# Patient Record
Sex: Female | Born: 1990 | Hispanic: Yes | Smoking: Never smoker
Health system: Southern US, Community
[De-identification: ages and names within clinical notes are randomized; demographics above are authoritative.]

## PROBLEM LIST (undated history)

## (undated) DIAGNOSIS — E079 Disorder of thyroid, unspecified: Secondary | ICD-10-CM

## (undated) DIAGNOSIS — L409 Psoriasis, unspecified: Secondary | ICD-10-CM

## (undated) DIAGNOSIS — R87619 Unspecified abnormal cytological findings in specimens from cervix uteri: Secondary | ICD-10-CM

## (undated) DIAGNOSIS — D134 Benign neoplasm of liver: Secondary | ICD-10-CM

## (undated) HISTORY — DX: Benign neoplasm of liver: D13.4

## (undated) HISTORY — DX: Psoriasis, unspecified: L40.9

## (undated) HISTORY — DX: Unspecified abnormal cytological findings in specimens from cervix uteri: R87.619

---

## 2011-07-21 ENCOUNTER — Emergency Department: Payer: Self-pay | Admitting: Emergency Medicine

## 2011-12-01 ENCOUNTER — Emergency Department: Payer: Self-pay | Admitting: Emergency Medicine

## 2012-01-16 ENCOUNTER — Emergency Department: Payer: Self-pay | Admitting: Emergency Medicine

## 2012-01-16 LAB — BASIC METABOLIC PANEL
Calcium, Total: 9.1 mg/dL (ref 8.5–10.1)
Co2: 24 mmol/L (ref 21–32)
Creatinine: 0.61 mg/dL (ref 0.60–1.30)
EGFR (Non-African Amer.): >60
Glucose: 99 mg/dL (ref 65–99)
Osmolality: 280 (ref 275–301)
Potassium: 3.4 mmol/L — ABNORMAL LOW (ref 3.5–5.1)
Sodium: 141 mmol/L (ref 136–145)

## 2012-01-16 LAB — CBC
HCT: 37.2 % (ref 35.0–47.0)
HGB: 12.7 g/dL (ref 12.0–16.0)
MCH: 31.8 pg (ref 26.0–34.0)
MCHC: 34.2 g/dL (ref 32.0–36.0)
MCV: 93 fL (ref 80–100)
RDW: 12 % (ref 11.5–14.5)
WBC: 5.5 10*3/uL (ref 3.6–11.0)

## 2012-01-17 LAB — CK-MB: CK-MB: <0.5 ng/mL — ABNORMAL LOW (ref 0.5–3.6)

## 2012-01-17 LAB — CK: CK, Total: 67 U/L (ref 21–215)

## 2012-01-17 LAB — TROPONIN I: Troponin-I: <0.02 ng/mL

## 2013-05-31 IMAGING — US US EXTREM LOW VENOUS*R*
1 series · 17 of 23 positions shown · non-contrast
Comparison: none

REASON FOR EXAM: right leg pain sudden onset last pm  no injury   Flex 1
COMMENTS:

[Series 1: us extrem low venous*right* · 17 of 23 slices shown]
[im 1/23]
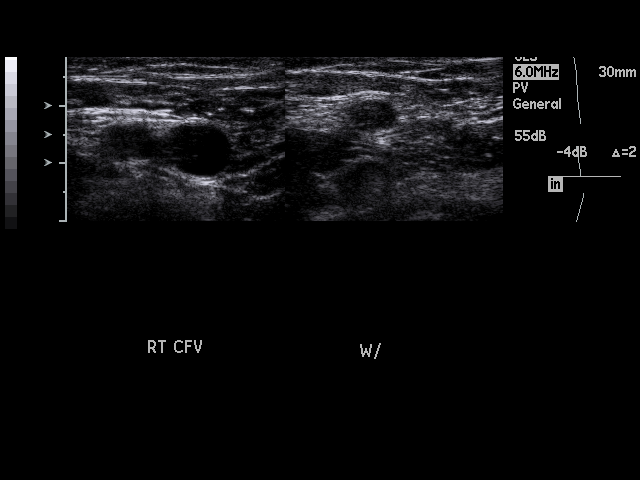
[im 3/23]
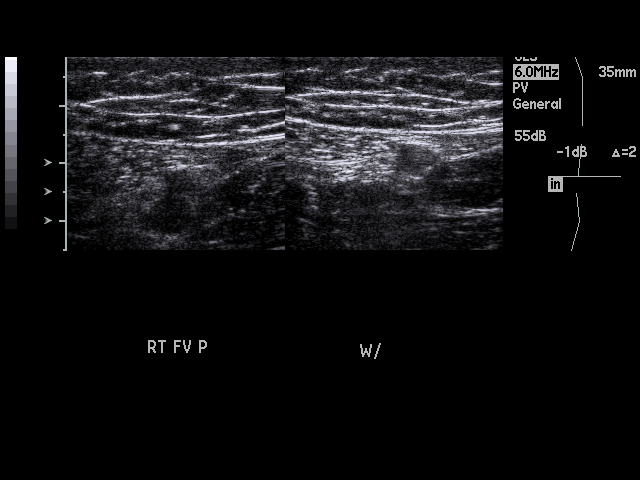
[im 4/23]
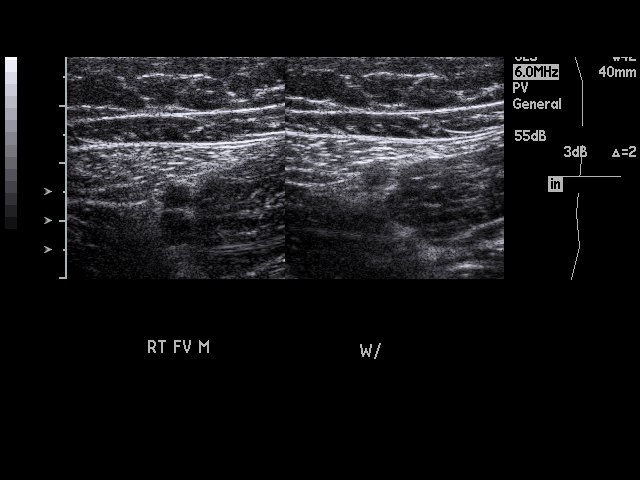
[im 5/23]
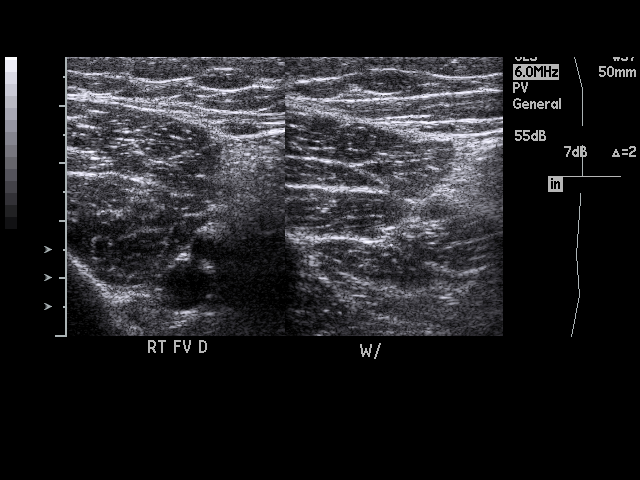
[im 7/23]
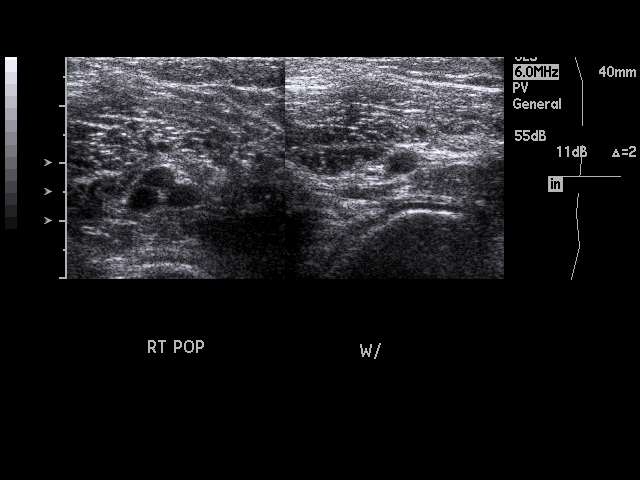
[im 8/23]
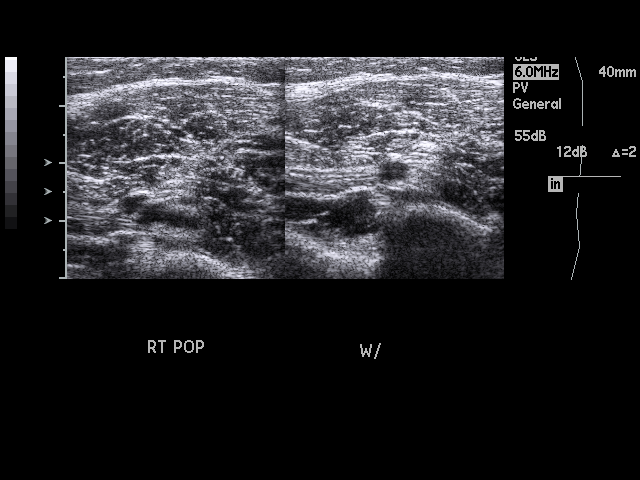
[im 9/23]
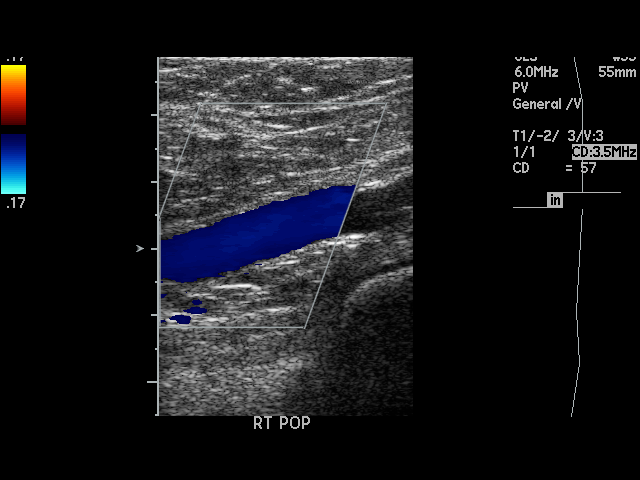
[im 11/23]
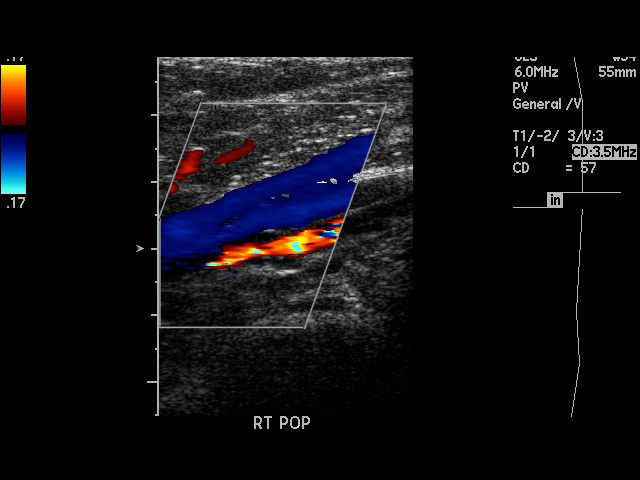
[im 12/23]
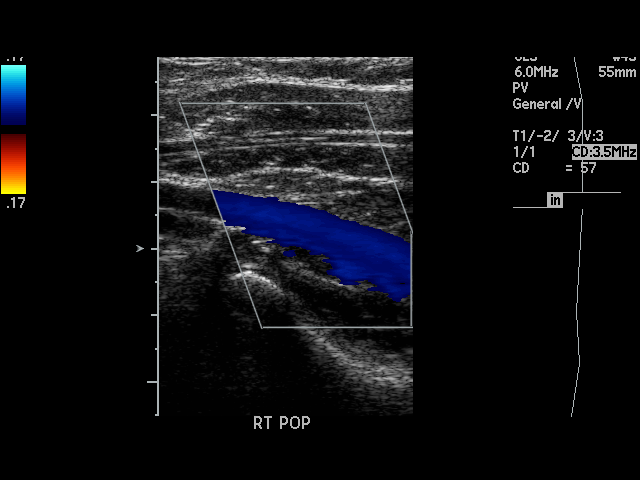
[im 13/23]
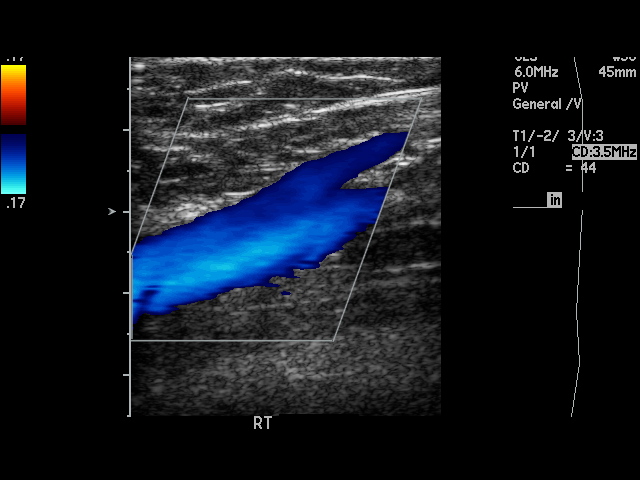
[im 15/23]
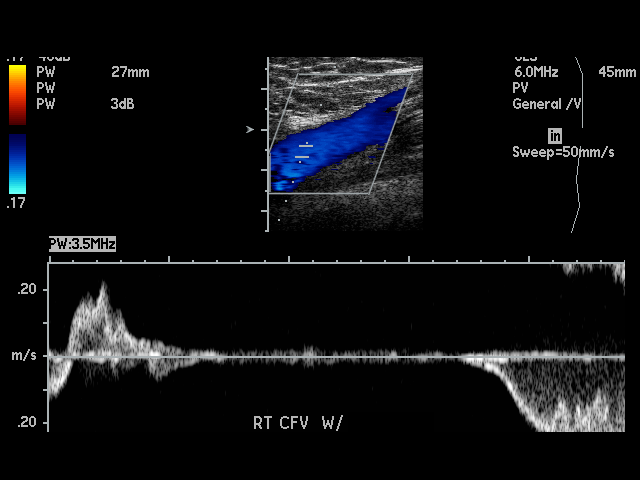
[im 16/23]
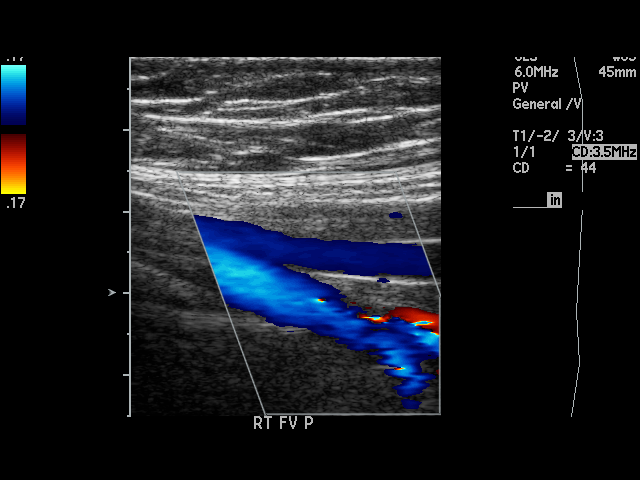
[im 17/23]
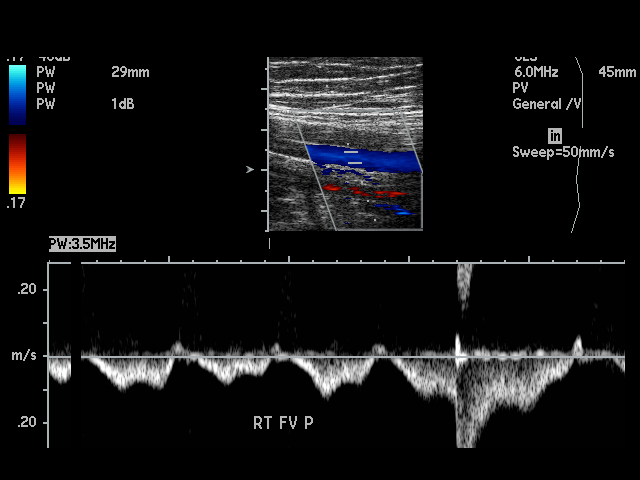
[im 19/23]
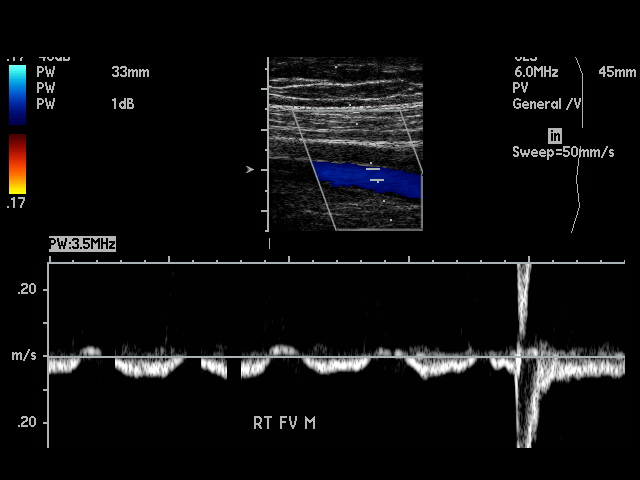
[im 20/23]
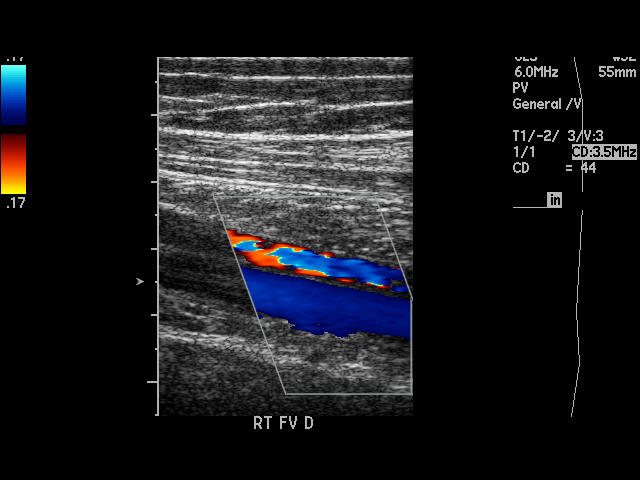
[im 21/23]
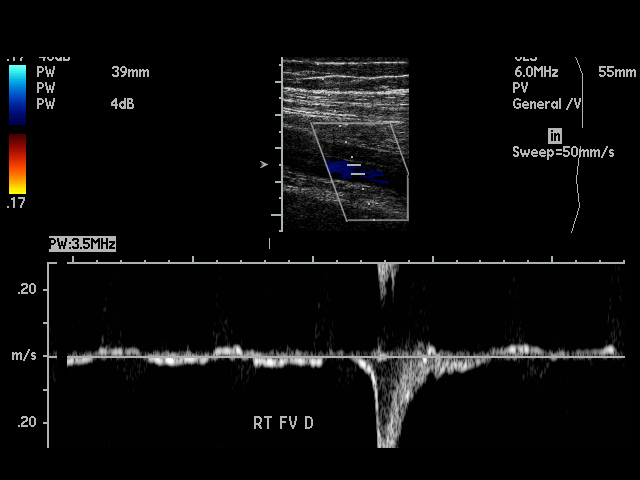
[im 23/23]
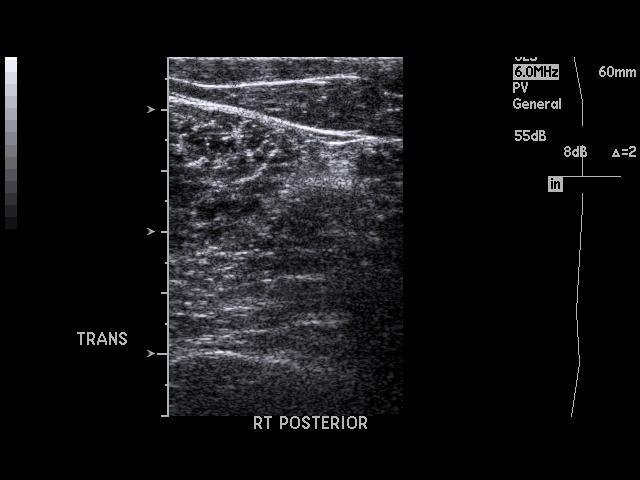

[17 of 23 positions shown; findings below may reference images not displayed]

PROCEDURE:     US  - US DOPPLER LOW EXTR RIGHT  - July 21, 2011  [DATE]

RESULT:     Comparison: None

Technique and findings: Multiple longitudinal and transverse grayscale as
well as color and spectral Doppler images of the right lower extremity veins
were obtained from the common femoral veins through the popliteal veins.

The right common femoral, femoral, and popliteal veins are patent,
demonstrating normal color-flow and compressibility. No intraluminal
thrombus is identified.  There is normal respiratory variation and
augmentation demonstrated at all vein levels. Limited images in the region
of pain in the posterior right thigh are unremarkable.
IMPRESSION: No evidence of DVT in the right lower extremity.

## 2013-07-08 DIAGNOSIS — D134 Benign neoplasm of liver: Secondary | ICD-10-CM

## 2013-07-08 HISTORY — DX: Benign neoplasm of liver: D13.4

## 2013-11-27 IMAGING — CR DG CHEST 2V
1 series · 2 of 2 positions shown · non-contrast
Comparison: none

REASON FOR EXAM: chest pain
COMMENTS:

[Series 1: w chest pa · 0.14mm/px · 2 of 2 slices shown]
[im 1/2]
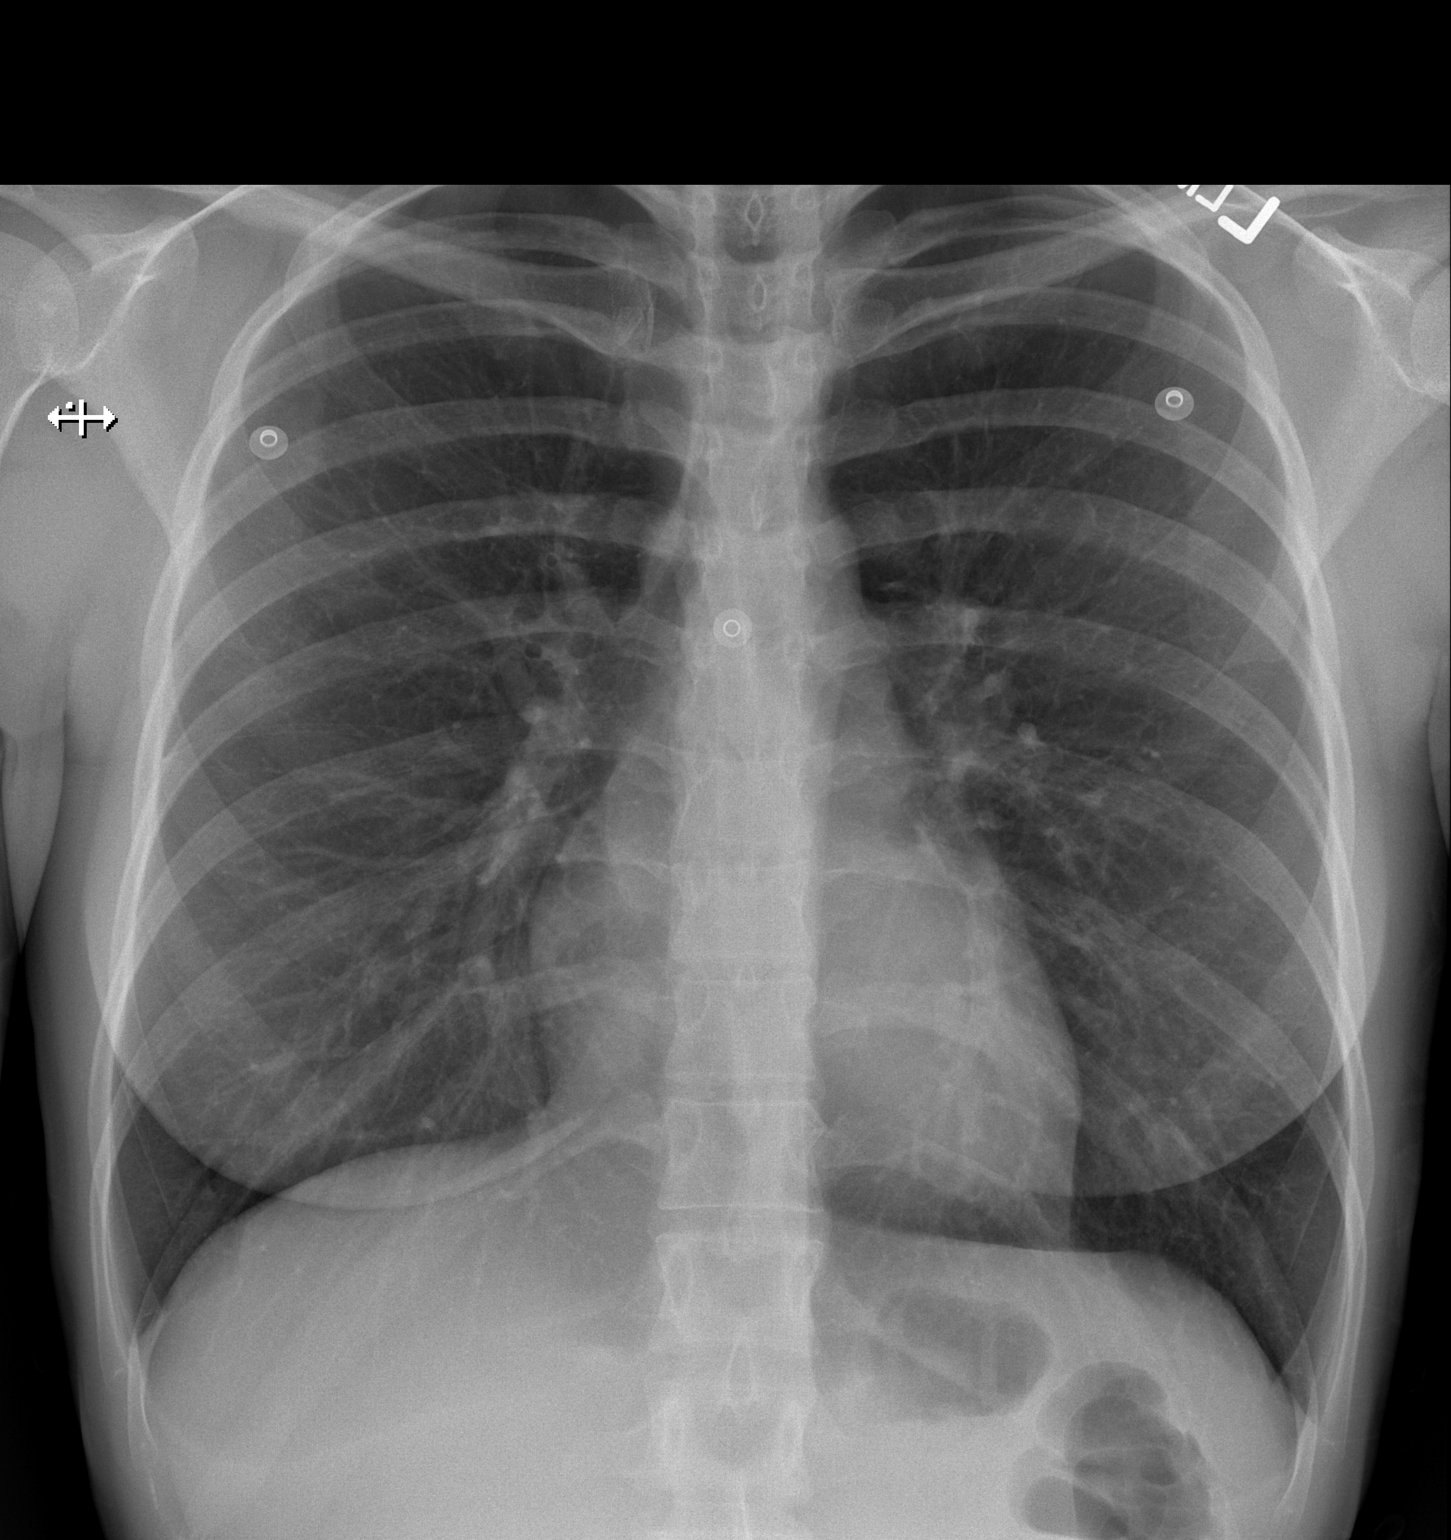
[im 2/2]
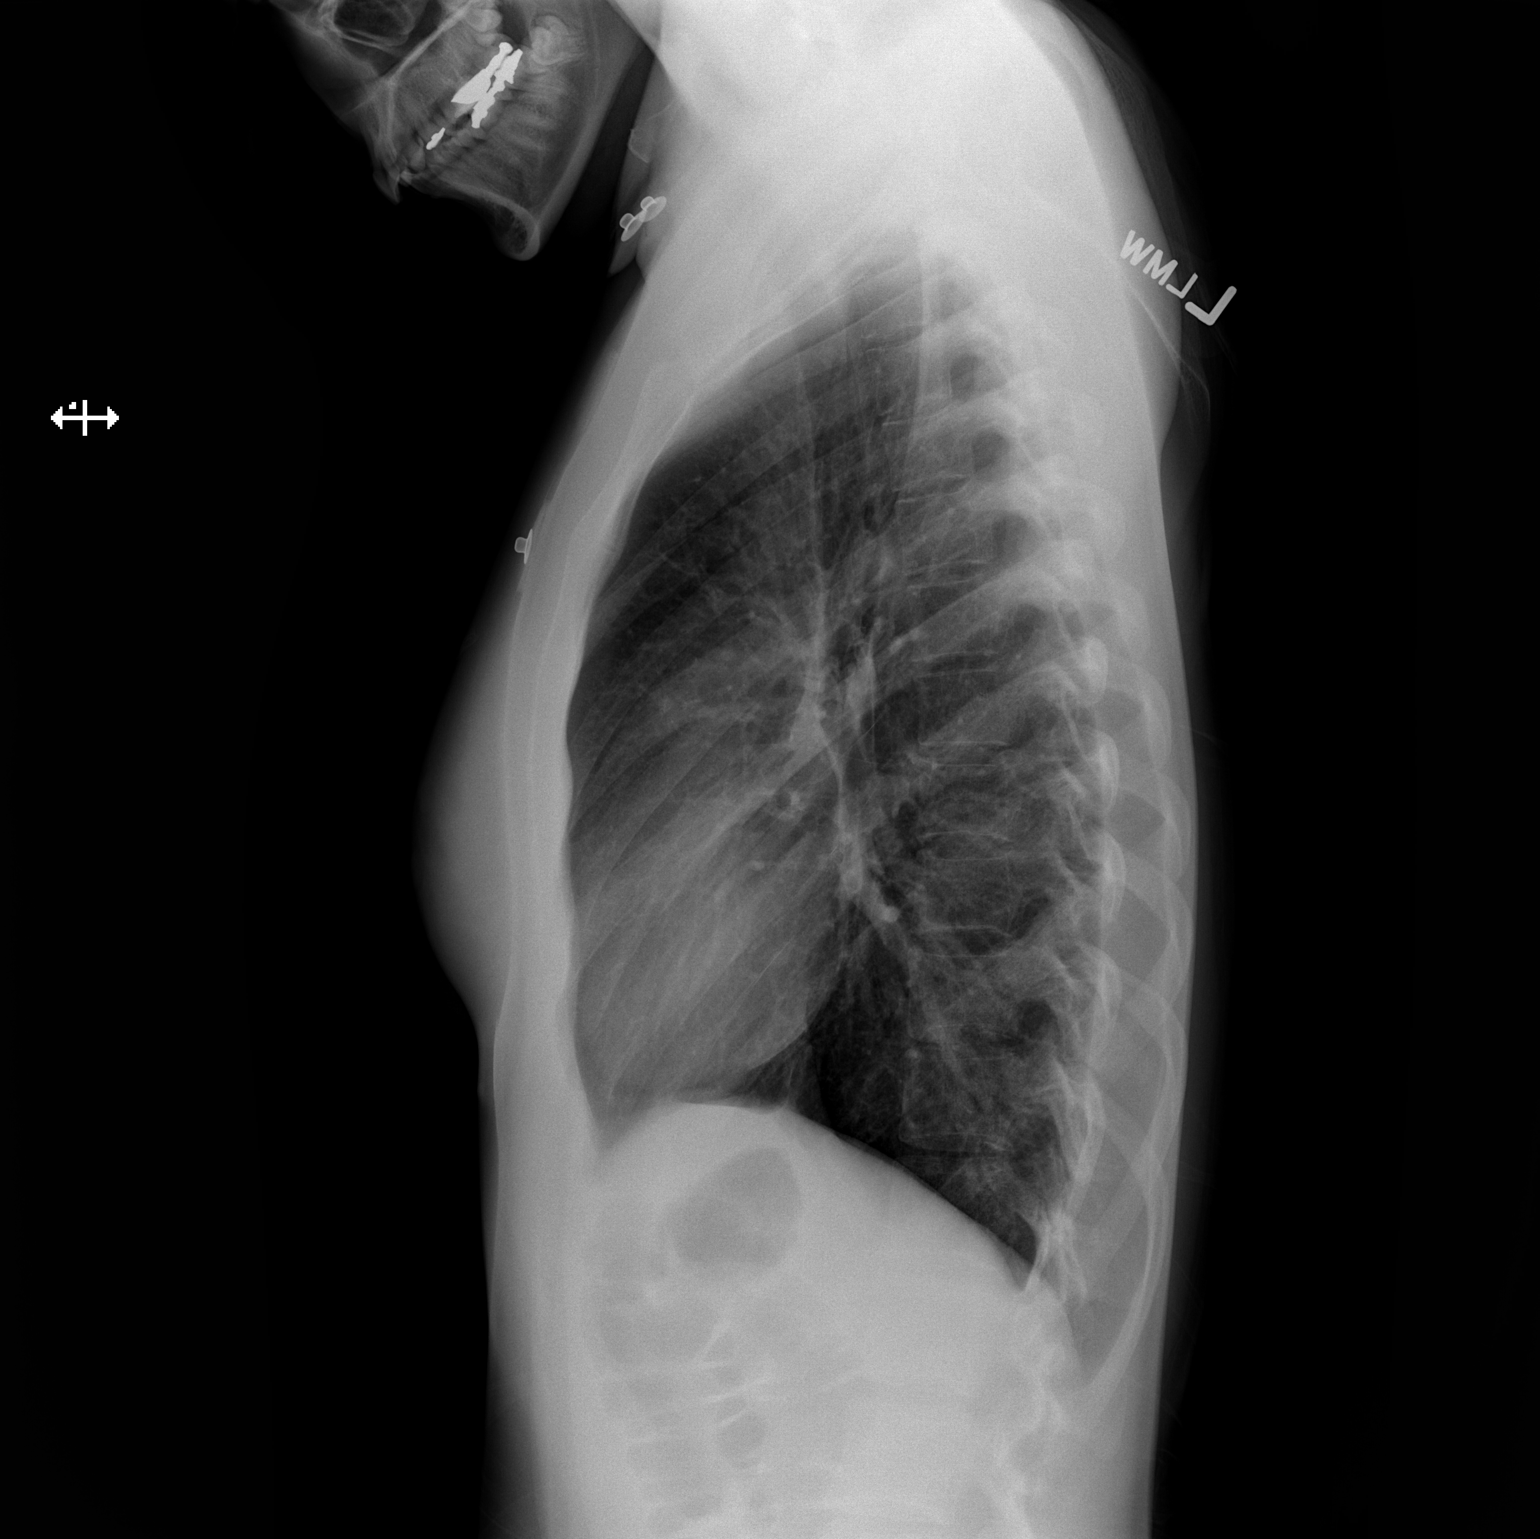

[2 of 2 positions shown; findings below may reference images not displayed]

PROCEDURE:     DXR - DXR CHEST PA (OR AP) AND LATERAL  - January 17, 2012  [DATE]

RESULT:     The lungs are mildly hyperinflated. There are coarse lung
markings in the retrocardiac region likely on the left. The cardiac
silhouette is normal in size. The pulmonary vascularity is not engorged. The
mediastinum is normal in width. There is no pneumothorax or
pneumomediastinum or pleural effusion. The bony thorax exhibits no acute
abnormality.
IMPRESSION: There is mild hyperinflation and there are coarse lung
markings in the retrocardiac region on the left. This may reflect reactive
airway disease and acute bronchitis with subsegmental atelectasis. Followup
films are recommended if the patient's symptoms persist despite therapy.

## 2014-07-08 DIAGNOSIS — L409 Psoriasis, unspecified: Secondary | ICD-10-CM

## 2014-07-08 HISTORY — DX: Psoriasis, unspecified: L40.9

## 2016-04-11 ENCOUNTER — Encounter: Payer: Self-pay | Admitting: Medical Oncology

## 2016-04-11 ENCOUNTER — Emergency Department
Admission: EM | Admit: 2016-04-11 | Discharge: 2016-04-11 | Disposition: A | Discharge status: Home / Self Care | Payer: Self-pay | Attending: Emergency Medicine | Admitting: Emergency Medicine

## 2016-04-11 DIAGNOSIS — B9689 Other specified bacterial agents as the cause of diseases classified elsewhere: Secondary | ICD-10-CM

## 2016-04-11 DIAGNOSIS — N760 Acute vaginitis: Secondary | ICD-10-CM | POA: Insufficient documentation

## 2016-04-11 HISTORY — DX: Disorder of thyroid, unspecified: E07.9

## 2016-04-11 LAB — URINALYSIS COMPLETE WITH MICROSCOPIC (ARMC ONLY)
BILIRUBIN URINE: NEGATIVE
GLUCOSE, UA: NEGATIVE mg/dL
Hgb urine dipstick: NEGATIVE
KETONES UR: NEGATIVE mg/dL
Nitrite: NEGATIVE
PH: 7 (ref 5.0–8.0)
Protein, ur: NEGATIVE mg/dL
Specific Gravity, Urine: 1.014 (ref 1.005–1.030)

## 2016-04-11 LAB — CHLAMYDIA/NGC RT PCR (ARMC ONLY)
Chlamydia Tr: DETECTED — AB
N gonorrhoeae: NOT DETECTED

## 2016-04-11 LAB — WET PREP, GENITAL
Clue Cells Wet Prep HPF POC: NONE SEEN
Sperm: NONE SEEN
Trich, Wet Prep: NONE SEEN
WBC, Wet Prep HPF POC: NONE SEEN
Yeast Wet Prep HPF POC: NONE SEEN

## 2016-04-11 LAB — POCT PREGNANCY, URINE: Preg Test, Ur: NEGATIVE

## 2016-04-11 MED ORDER — METRONIDAZOLE 500 MG PO TABS: 500.0000 mg | ORAL_TABLET | Freq: Two times a day (BID) | ORAL | 0 refills | Status: AC

## 2016-04-11 NOTE — ED Provider Notes (Signed)
[facility] Emergency Department Provider Note   ____________________________________________   First MD Initiated Contact with Patient 04/11/16 1446     (approximate)  I have reviewed the triage vital signs and the nursing notes.   HISTORY  Chief Complaint Dysuria   HPI Cathy Oneal is a 25 y.o. female who presents with dysuria x1 day. Patient also notes that she has had a fishy odor and copious vaginal discharge x1 week. Discharge is clear and thick. Denies vaginal bleeding, hematuria, change in sexual partner, abdominal pain or fever. Patient states she thinks that she may have bacterial vaginosis, and that she has had this in the past.    Past Medical History:  Diagnosis Date  . Thyroid disease     There are no active problems to display for this patient.   No past surgical history on file.  Prior to Admission medications   Medication Sig Start Date End Date Taking? Authorizing Provider  metroNIDAZOLE (FLAGYL) 500 MG tablet Take 1 tablet (500 mg total) by mouth 2 (two) times daily. 04/11/16 04/25/16  Arlyss Repress, PA-C    Allergies Review of patient's allergies indicates no known allergies.  No family history on file.  Social History Social History  Substance Use Topics  . Smoking status: Not on file  . Smokeless tobacco: Not on file  . Alcohol use Not on file    Review of Systems Constitutional: No fever/chills Gastrointestinal: No abdominal pain.  No nausea, no vomiting.  No diarrhea.   Genitourinary: Positive for dysuria and vaginal discharge. Musculoskeletal: Negative for back pain. Skin: Negative for rash. Neurological: Negative for headaches, focal weakness or numbness.  ____________________________________________   PHYSICAL EXAM:  VITAL SIGNS: ED Triage Vitals  Enc Vitals Group     BP 04/11/16 1434 130/85     Pulse Rate 04/11/16 1434 99     Resp 04/11/16 1434 18     Temp 04/11/16 1434 98.3 F (36.8 C)   Temp Source 04/11/16 1434 Oral     SpO2 04/11/16 1434 100 %     Weight 04/11/16 1435 105 lb (47.6 kg)     Height 04/11/16 1435 5\' 2"  (1.575 m)     Head Circumference --      Peak Flow --      Pain Score --      Pain Loc --      Pain Edu? --      Excl. in [place]? --     Constitutional: Alert and oriented. Well appearing and in no acute distress. Eyes: Conjunctivae are normal. Head: Atraumatic. Neck: No stridor. Supple, full ROM without pain or difficulty. Cardiovascular: Normal rate, regular rhythm. Grossly normal heart sounds.  Good peripheral circulation. Respiratory: Normal respiratory effort.  No retractions. Lungs CTAB. Gastrointestinal: Soft and nontender. No distention.  Genitourinary: External genitalia normal. Vaginal mucosa normal in appearance with moderate clear discharge present. Unable to visualized cervix. No CMT. Uterus and adnexa normal on bimanual.  Musculoskeletal: Full ROM in all extremities without pain or difficulty. Neurologic:  Normal speech and language. No gross focal neurologic deficits are appreciated. No gait instability. Skin:  Skin is warm, dry and intact. No rash noted. Psychiatric: Mood and affect are normal. Speech and behavior are normal.  ____________________________________________   LABS (all labs ordered are listed, but only abnormal results are displayed)  Labs Reviewed  URINALYSIS COMPLETEWITH MICROSCOPIC ([place]) - Abnormal; Notable for the following:       Result Value   Color,  Urine YELLOW (*)    APPearance CLEAR (*)    Leukocytes, UA 1+ (*)    Bacteria, UA RARE (*)    Squamous Epithelial / LPF 6-30 (*)    All other components within normal limits  WET PREP, GENITAL  CHLAMYDIA/NGC RT PCR ([facility] ONLY)  POC URINE PREG, ED  POCT PREGNANCY, URINE    ____________________________________________  EKG  None. ____________________________________________  RADIOLOGY  None. ____________________________________________   PROCEDURES  Procedure(s) performed: None  Procedures  Critical Care performed: No  ____________________________________________   INITIAL IMPRESSION / ASSESSMENT AND PLAN / ED COURSE  Pertinent labs & imaging results that were available during my care of the patient were reviewed by me and considered in my medical decision making (see chart for details).  Patient presentation clinically looks like bacterial vaginosis, despite negative wet prep. Patient given prescription for flagyl. GC/CHL pending, patient will be notified of results. Patient to follow up with PCP if no improvement. No other emergency medicine complaints at this time.   Clinical Course     ____________________________________________   FINAL CLINICAL IMPRESSION(S) / ED DIAGNOSES  Final diagnoses:  BV (bacterial vaginosis)      NEW MEDICATIONS STARTED DURING THIS VISIT:  New Prescriptions   METRONIDAZOLE (FLAGYL) 500 MG TABLET    Take 1 tablet (500 mg total) by mouth 2 (two) times daily.     Note:  This document was prepared using Dragon voice recognition software and may include unintentional dictation errors.   Arlyss Repress, PA-C 04/11/16 1825    Schuyler Amor, MD 04/11/16 [phone]

## 2016-04-11 NOTE — ED Triage Notes (Signed)
Pt reports burning with urination and urine has odor. Pt denies fever.

## 2016-04-12 ENCOUNTER — Telehealth: Payer: Self-pay | Admitting: Emergency Medicine

## 2016-04-12 NOTE — Telephone Encounter (Addendum)
Called patient to notify of positive chlamydia test and need for treatment.  Per Dr. Dario Guardian call in azithromycin 1 gram po for patient, but would also recommend she be given rocephin IM--Patient can go to [facility] for that.  I left a message asking her to call me.  The patient called me back  I explained that she could follow up at [facility] or her doctor and that dr Quentin Cornwall recommended rocephin as well, but I will call her in azithromycin to [address].

## 2016-07-11 ENCOUNTER — Emergency Department
Admission: EM | Admit: 2016-07-11 | Discharge: 2016-07-11 | Disposition: A | Discharge status: Home / Self Care | Payer: Self-pay | Attending: Emergency Medicine | Admitting: Emergency Medicine

## 2016-07-11 ENCOUNTER — Encounter: Payer: Self-pay | Admitting: Intensive Care

## 2016-07-11 DIAGNOSIS — L234 Allergic contact dermatitis due to dyes: Secondary | ICD-10-CM | POA: Insufficient documentation

## 2016-07-11 DIAGNOSIS — E039 Hypothyroidism, unspecified: Secondary | ICD-10-CM | POA: Insufficient documentation

## 2016-07-11 DIAGNOSIS — Z79899 Other long term (current) drug therapy: Secondary | ICD-10-CM | POA: Insufficient documentation

## 2016-07-11 MED ORDER — METHYLPREDNISOLONE SODIUM SUCC 125 MG IJ SOLR
80.0000 mg | Freq: Once | INTRAMUSCULAR | Status: AC
  Administered 2016-07-11: 80 mg via INTRAMUSCULAR
  Filled 2016-07-11: qty 2

## 2016-07-11 MED ORDER — METHYLPREDNISOLONE 4 MG PO TBPK: ORAL_TABLET | ORAL | 0 refills | Status: DC

## 2016-07-11 MED ORDER — HYDROXYZINE HCL 50 MG PO TABS: 50.0000 mg | ORAL_TABLET | Freq: Three times a day (TID) | ORAL | 0 refills | Status: DC | PRN

## 2016-07-11 MED ORDER — ALOE AFTERSUN EX GEL: 1.0000 "application " | Freq: Three times a day (TID) | CUTANEOUS | 0 refills | Status: DC

## 2016-07-11 MED ORDER — HYDROXYZINE HCL 50 MG PO TABS
50.0000 mg | ORAL_TABLET | Freq: Once | ORAL | Status: AC
  Administered 2016-07-11: 50 mg via ORAL
  Filled 2016-07-11: qty 1

## 2016-07-11 NOTE — ED Provider Notes (Signed)
[facility] Emergency Department Provider Note   ____________________________________________   First MD Initiated Contact with Patient 07/11/16 1303     (approximate)  I have reviewed the triage vital signs and the nursing notes.   HISTORY  Chief Complaint Facial Swelling    HPI Cathy Oneal is a 26 y.o. female patient complaining of swelling and pain to the forehead left facial area secondary to using hair dye 2 days ago. Patient denies any anaphylactic signs symptoms. Patient stated this intense itching. Patient rates the pain discomfort as a 6/10. No palliative measures for this complaint.   Past Medical History:  Diagnosis Date  . Thyroid disease    hypo    There are no active problems to display for this patient.   History reviewed. No pertinent surgical history.  Prior to Admission medications   Medication Sig Start Date End Date Taking? Authorizing Provider  diphenhydrAMINE (SOMINEX) 25 MG tablet Take 25 mg by mouth at bedtime as needed for sleep.   Yes Historical Provider, MD  Aloe Vera (ALOE AFTERSUN) GEL Apply 1 application topically 3 (three) times daily. 07/11/16   Sable Feil, PA-C  hydrOXYzine (ATARAX/VISTARIL) 50 MG tablet Take 1 tablet (50 mg total) by mouth 3 (three) times daily as needed. 07/11/16   Sable Feil, PA-C  methylPREDNISolone (MEDROL DOSEPAK) 4 MG TBPK tablet Take Tapered dose as directed 07/11/16   Sable Feil, PA-C    Allergies Patient has no known allergies.  History reviewed. No pertinent family history.  Social History Social History  Substance Use Topics  . Smoking status: Never Smoker  . Smokeless tobacco: Never Used  . Alcohol use 2.4 oz/week    4 Glasses of wine per week     Comment: On weekends    Review of Systems Constitutional: No fever/chills Eyes: No visual changes. ENT: No sore throat. Cardiovascular: Denies chest pain. Respiratory: Denies shortness of breath. Gastrointestinal:  No abdominal pain.  No nausea, no vomiting.  No diarrhea.  No constipation. Genitourinary: Negative for dysuria. Musculoskeletal: Negative for back pain. Skin: Negative for rash. Neurological: Negative for headaches, focal weakness or numbness. Endocrine:Hyperthyroidism with noncompliance of medication ____________________________________________   PHYSICAL EXAM:  VITAL SIGNS: ED Triage Vitals  Enc Vitals Group     BP 07/11/16 1212 123/67     Pulse Rate 07/11/16 1212 89     Resp 07/11/16 1212 18     Temp 07/11/16 1212 98.6 F (37 C)     Temp Source 07/11/16 1212 Oral     SpO2 07/11/16 1212 100 %     Weight 07/11/16 1212 110 lb (49.9 kg)     Height 07/11/16 1212 5\' 2"  (1.575 m)     Head Circumference --      Peak Flow --      Pain Score 07/11/16 1224 6     Pain Loc --      Pain Edu? --      Excl. in [place]? --     Constitutional: Alert and oriented. Well appearing and in no acute distress. Eyes: Conjunctivae are normal. PERRL. EOMI. Head: Atraumatic. Nose: No congestion/rhinnorhea. Mouth/Throat: Mucous membranes are moist.  Oropharynx non-erythematous. Neck: No stridor.  No cervical spine tenderness to palpation. Hematological/Lymphatic/Immunilogical: No cervical lymphadenopathy. Cardiovascular: Normal rate, regular rhythm. Grossly normal heart sounds.  Good peripheral circulation. Respiratory: Normal respiratory effort.  No retractions. Lungs CTAB. Gastrointestinal: Soft and nontender. No distention. No abdominal bruits. No CVA tenderness. Musculoskeletal: No lower extremity  tenderness nor edema.  No joint effusions. Neurologic:  Normal speech and language. No gross focal neurologic deficits are appreciated. No gait instability. Skin:  Skin is warm, dry and intact. Vesicle lesions on erythematous base superior aspect of the forehead Psychiatric: Mood and affect are normal. Speech and behavior are normal.  ____________________________________________   LABS (all labs  ordered are listed, but only abnormal results are displayed)  Labs Reviewed - No data to display ____________________________________________  EKG   ____________________________________________  RADIOLOGY   ____________________________________________   PROCEDURES  Procedure(s) performed: None  Procedures  Critical Care performed: No  ____________________________________________   INITIAL IMPRESSION / ASSESSMENT AND PLAN / ED COURSE  Pertinent labs & imaging results that were available during my care of the patient were reviewed by me and considered in my medical decision making (see chart for details).  Contact dermatitis secondary to hair dye. Patient given discharge care instructions. Patient given prescription for Atarax, Medrol Dosepak, and Aloe gel. Patient advised to follow-up with "clinic if condition persists.  Clinical Course      ____________________________________________   FINAL CLINICAL IMPRESSION(S) / ED DIAGNOSES  Final diagnoses:  Allergic contact dermatitis due to dyes      NEW MEDICATIONS STARTED DURING THIS VISIT:  New Prescriptions   ALOE VERA (ALOE AFTERSUN) GEL    Apply 1 application topically 3 (three) times daily.   HYDROXYZINE (ATARAX/VISTARIL) 50 MG TABLET    Take 1 tablet (50 mg total) by mouth 3 (three) times daily as needed.   METHYLPREDNISOLONE (MEDROL DOSEPAK) 4 MG TBPK TABLET    Take Tapered dose as directed     Note:  This document was prepared using Dragon voice recognition software and may include unintentional dictation errors.     Sable Feil, PA-C 07/11/16 1320    Earleen Newport, MD 07/11/16 [phone]

## 2016-07-11 NOTE — ED Triage Notes (Addendum)
Patient presents to ER with c/o swelling to forehead and L sided facial swelling. Patient also has minor burns to front of scalp after dying her hair at home X2 days ago. No swelling noted to throat. No problems speaking, eating, or drinking. Patient in NAD in triage.

## 2016-07-11 NOTE — ED Notes (Signed)
See triage note  States she dyed her hair 2 days ago woke up this am with swelling to forehead and back of neck  No resp distress noted at present

## 2017-03-07 ENCOUNTER — Encounter: Payer: Self-pay | Admitting: Internal Medicine

## 2017-03-07 ENCOUNTER — Ambulatory Visit (INDEPENDENT_AMBULATORY_CARE_PROVIDER_SITE_OTHER): Payer: Self-pay | Admitting: Internal Medicine

## 2017-03-07 VITALS — BP 102/78 | HR 84 | Resp 12 | Ht 62.0 in | Wt 128.0 lb

## 2017-03-07 DIAGNOSIS — E079 Disorder of thyroid, unspecified: Secondary | ICD-10-CM | POA: Insufficient documentation

## 2017-03-07 DIAGNOSIS — R220 Localized swelling, mass and lump, head: Secondary | ICD-10-CM

## 2017-03-07 DIAGNOSIS — D134 Benign neoplasm of liver: Secondary | ICD-10-CM

## 2017-03-07 DIAGNOSIS — R87619 Unspecified abnormal cytological findings in specimens from cervix uteri: Secondary | ICD-10-CM | POA: Insufficient documentation

## 2017-03-07 DIAGNOSIS — E039 Hypothyroidism, unspecified: Secondary | ICD-10-CM

## 2017-03-07 DIAGNOSIS — L409 Psoriasis, unspecified: Secondary | ICD-10-CM

## 2017-03-07 DIAGNOSIS — J3489 Other specified disorders of nose and nasal sinuses: Secondary | ICD-10-CM

## 2017-03-07 MED ORDER — LEVOTHYROXINE SODIUM 100 MCG PO TABS: 100.0000 ug | ORAL_TABLET | Freq: Every day | ORAL | 3 refills | Status: DC

## 2017-03-07 MED ORDER — TRIAMCINOLONE ACETONIDE 0.1 % EX CREA: 1.0000 "application " | TOPICAL_CREAM | Freq: Two times a day (BID) | CUTANEOUS | 0 refills | Status: AC

## 2017-03-07 NOTE — Progress Notes (Signed)
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 Pt reported that she has trouble sleeping and at times cannot control her worrying. She reported stress about getting her medications. No other major concerns noted. [facility] provided contact information to pt.

## 2017-03-07 NOTE — Patient Instructions (Signed)
Can google "advance directives, [place]"  And bring up form from Secretary of State. Print and fill out Or can go to "5 wishes"  Which is also in Spanish and fill out--this costs $5--perhaps easier to use. Designate a Medical Power of Attorney to speak for you if you are unable to speak for yourself when ill or injured  

## 2017-03-07 NOTE — Progress Notes (Signed)
   Subjective:    Patient ID: Cathy Oneal, female    DOB: 1990/11/30, 26 y.o.   MRN: [id number]  HPI   Here to establish.  1.  Hypothyroidism:  Diagnosed age 56 yo.  Has been taking Levothyroxine 100 mcg daily and that has worked well.    Has been without her hormone replacement for 1.5 years and is tired.  Mild weight gain.  No constipation or dry skin.  Hair falls out, but is not sure if that is any different from when she takes hormone.  2.  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  Using Tea Tree oil with good results  3.  Right thigh area with intermittent pain and sense of "popping out of place".  Notes this most when standing form a sitting position.  Hurts to bear weight.  Feels it "pop"  Back into place and then fine.     4.  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  No obstruction of breathing.  No history of nasal fracture.   Review of Systems     Objective:   Physical Exam NAD HEENT: PERRL, EOMI, TMs pearly gray, Throat without injection.  opening of right nostril at base with more prominent bone on palpation.  Does appear to have some septal deviation possibly making this more prominent. Neck:  Supple, No adenopathy.  Thyroid enlarged and with what feels like a left thyroid nodule. Chest:  CTA CV:  RRR with normal S1 and S2, No S3, S4 or murmur.  Radial and DP pulses normal and equal. Right thigh:  Tender down tensor fascia lata.  No definite point tenderness over greater trochanter.       Assessment & Plan:  1.  Hypothyroidism:  TSH.  Would also like to send for thyroid ultrasound, which she has not had before.  Refill her levothyroxine 100 mcg daily.  If symptoms of over replacement, to call. She will need to apply for orange card before we send for ultrasound.  Has not been a resident yet for 3 months.  2.  Psoriasis:  Currently controlled with home remedies/OTC  3.  ?Nasal mass:  This may be more  due to some septal deviation rather than an actual mass.  Again, needs orange card for referral to ENT.  Has had this for many years.  4.  History of abnormal Pap, liver tumor:  Need old records and will set up for CPE and pap in next 3 months.

## 2017-03-08 LAB — TSH: TSH: 5.96 u[IU]/mL — AB (ref 0.450–4.500)

## 2017-04-08 ENCOUNTER — Encounter: Payer: Self-pay | Admitting: Internal Medicine

## 2017-04-08 ENCOUNTER — Ambulatory Visit (INDEPENDENT_AMBULATORY_CARE_PROVIDER_SITE_OTHER): Payer: Self-pay | Admitting: Internal Medicine

## 2017-04-08 VITALS — BP 122/78 | HR 72 | Resp 12 | Ht 62.0 in | Wt 128.0 lb

## 2017-04-08 DIAGNOSIS — E039 Hypothyroidism, unspecified: Secondary | ICD-10-CM

## 2017-04-08 DIAGNOSIS — E079 Disorder of thyroid, unspecified: Secondary | ICD-10-CM

## 2017-04-08 DIAGNOSIS — E162 Hypoglycemia, unspecified: Secondary | ICD-10-CM

## 2017-04-08 LAB — GLUCOSE, POCT (MANUAL RESULT ENTRY): POC GLUCOSE: 100 mg/dL — AB (ref 70–99)

## 2017-04-08 MED ORDER — LEVOTHYROXINE SODIUM 100 MCG PO TABS: ORAL_TABLET | ORAL | 3 refills | Status: AC

## 2017-04-08 NOTE — Progress Notes (Signed)
   Subjective:    Patient ID: Cathy Oneal, female    DOB: [birth year], 26 y.o.   MRN: [id number]  HPI     Waking up at night gets dizzy and feels weak.  When stands, gets sweaty and dizzy.  If drinks a soda and generally feels better.  Past 2 days, drinking a soda and not going away. Reason she feels this is due to low blood sugar is the first time this occurred, her mother in law checked her blood glucose and was 62--this happened at noon 1 month ago.   States this first occurred about 1 year ago when breast feeding and has just continued.  States this has continued nightly for about 1 year.  Has become worse with time.   States particularly worse about 3 days ago as not responding to the soda as above She does not feel this worsened after she restarted the levothyroxine on September 1st.   Does feel tremulous throughout the day, but states this is not new since restarting her levothyroxine (off of this for 1 year until Sept 1)  Has had diarrhea--2 weeks ago.   No palpitations No weight loss. No increase in hunger.    Diet:   Morning:  PB, chia seeds, water and ice.  PB is likely about 3 + tablespoons. 4 cups of coffee every morning.  About 5 ounces each Slice of pie or cake for snack Lunch:  Chicken, avocado, water or coffee, sometimes wine or beer Dinner:  Some sort of meat, avocados, onions, lime, taco.  Maybe fruit. 3-4 glasses of wine.   Goes to sleep 8:30-10:00 Wakes every 2 hours  Midnight, 2:30a.m., 4:30 a.m. 7:00 a.m.: skittles, soda.  Each time waking, feels shaky and weak.  Had some peanut butter yesterday and helped a lot with these symptoms.  Current Meds  Medication Sig  . levothyroxine (SYNTHROID, LEVOTHROID) 100 MCG tablet Take 1 tablet (100 mcg total) by mouth daily before breakfast.   No Known Allergies    Review of Systems     Objective:   Physical Exam        Assessment & Plan:

## 2017-04-18 ENCOUNTER — Other Ambulatory Visit: Payer: Self-pay

## 2017-05-07 ENCOUNTER — Other Ambulatory Visit: Payer: Self-pay

## 2017-06-27 ENCOUNTER — Encounter: Payer: Self-pay | Admitting: Internal Medicine
